# Patient Record
Sex: Female | Born: 1995 | Race: Black or African American | Hispanic: No | Marital: Single | Smoking: Never smoker
Health system: Southern US, Community
[De-identification: ages and names within clinical notes are randomized; demographics above are authoritative.]

## PROBLEM LIST (undated history)

## (undated) DIAGNOSIS — J45909 Unspecified asthma, uncomplicated: Secondary | ICD-10-CM

## (undated) DIAGNOSIS — Z8711 Personal history of peptic ulcer disease: Secondary | ICD-10-CM

## (undated) DIAGNOSIS — Z862 Personal history of diseases of the blood and blood-forming organs and certain disorders involving the immune mechanism: Secondary | ICD-10-CM

## (undated) DIAGNOSIS — Z8742 Personal history of other diseases of the female genital tract: Secondary | ICD-10-CM

## (undated) DIAGNOSIS — Z8619 Personal history of other infectious and parasitic diseases: Secondary | ICD-10-CM

## (undated) HISTORY — DX: Morbid (severe) obesity due to excess calories: E66.01

## (undated) HISTORY — DX: Personal history of diseases of the blood and blood-forming organs and certain disorders involving the immune mechanism: Z86.2

## (undated) HISTORY — DX: Personal history of other diseases of the female genital tract: Z87.42

## (undated) HISTORY — DX: Personal history of peptic ulcer disease: Z87.11

## (undated) HISTORY — PX: OTHER SURGICAL HISTORY: SHX169

## (undated) HISTORY — PX: APPENDECTOMY: SHX54

## (undated) HISTORY — DX: Personal history of other infectious and parasitic diseases: Z86.19

---
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
## 2018-03-18 ENCOUNTER — Emergency Department: Admission: EM | Admit: 2018-03-18 | Discharge: 2018-03-18 | Discharge status: Absent without leave | Payer: Self-pay

## 2018-03-28 ENCOUNTER — Other Ambulatory Visit: Payer: Self-pay

## 2018-03-28 ENCOUNTER — Encounter: Payer: Self-pay | Admitting: Emergency Medicine

## 2018-03-28 DIAGNOSIS — Z79899 Other long term (current) drug therapy: Secondary | ICD-10-CM | POA: Insufficient documentation

## 2018-03-28 DIAGNOSIS — R110 Nausea: Secondary | ICD-10-CM | POA: Insufficient documentation

## 2018-03-28 DIAGNOSIS — J45909 Unspecified asthma, uncomplicated: Secondary | ICD-10-CM | POA: Insufficient documentation

## 2018-03-28 DIAGNOSIS — E860 Dehydration: Principal | ICD-10-CM | POA: Insufficient documentation

## 2018-03-28 DIAGNOSIS — D72829 Elevated white blood cell count, unspecified: Secondary | ICD-10-CM | POA: Insufficient documentation

## 2018-03-28 DIAGNOSIS — R0981 Nasal congestion: Secondary | ICD-10-CM | POA: Insufficient documentation

## 2018-03-28 DIAGNOSIS — I959 Hypotension, unspecified: Secondary | ICD-10-CM | POA: Insufficient documentation

## 2018-03-28 DIAGNOSIS — Z634 Disappearance and death of family member: Secondary | ICD-10-CM | POA: Insufficient documentation

## 2018-03-28 DIAGNOSIS — R000 Tachycardia, unspecified: Secondary | ICD-10-CM | POA: Insufficient documentation

## 2018-03-28 DIAGNOSIS — Z888 Allergy status to other drugs, medicaments and biological substances status: Secondary | ICD-10-CM | POA: Insufficient documentation

## 2018-03-28 DIAGNOSIS — R509 Fever, unspecified: Secondary | ICD-10-CM | POA: Insufficient documentation

## 2018-03-28 DIAGNOSIS — J029 Acute pharyngitis, unspecified: Secondary | ICD-10-CM | POA: Insufficient documentation

## 2018-03-28 DIAGNOSIS — F4321 Adjustment disorder with depressed mood: Secondary | ICD-10-CM | POA: Insufficient documentation

## 2018-03-28 NOTE — ED Triage Notes (Signed)
Patient to ER from home via [facility] for c/o flu-like symptoms. Patient states she has had fever, chills, and body aches. Unknown temp at home.

## 2018-03-28 NOTE — ED Notes (Signed)
Pt declining flu swab at this time. Pt stating, "My nose hurts too bad. I don't want you sticking that down my nose." Pt informed that this was the only way to check for the flu and that it would take time to get the results back. This tech reminded pt that she was here for flu like symptoms...pt still declining flu swab.

## 2018-03-28 NOTE — ED Notes (Signed)
Patient refused flu swab

## 2018-03-29 ENCOUNTER — Observation Stay
Admission: EM | Admit: 2018-03-29 | Discharge: 2018-03-30 | Disposition: A | Discharge status: Home / Self Care | Payer: Self-pay | Attending: Family Medicine | Admitting: Family Medicine

## 2018-03-29 ENCOUNTER — Emergency Department: Payer: Self-pay

## 2018-03-29 DIAGNOSIS — F4321 Adjustment disorder with depressed mood: Secondary | ICD-10-CM

## 2018-03-29 DIAGNOSIS — R6889 Other general symptoms and signs: Secondary | ICD-10-CM | POA: Diagnosis present

## 2018-03-29 DIAGNOSIS — A419 Sepsis, unspecified organism: Secondary | ICD-10-CM

## 2018-03-29 HISTORY — DX: Unspecified asthma, uncomplicated: J45.909

## 2018-03-29 LAB — RESPIRATORY PANEL BY PCR

## 2018-03-29 LAB — URINALYSIS, COMPLETE (UACMP) WITH MICROSCOPIC
Bilirubin Urine: NEGATIVE
Glucose, UA: NEGATIVE mg/dL
HGB URINE DIPSTICK: NEGATIVE
Ketones, ur: 80 mg/dL — AB
Leukocytes, UA: NEGATIVE
NITRITE: NEGATIVE
Protein, ur: NEGATIVE mg/dL
Specific Gravity, Urine: 1.024 (ref 1.005–1.030)
pH: 5 (ref 5.0–8.0)

## 2018-03-29 LAB — CBC
HCT: 34.1 % — ABNORMAL LOW (ref 36.0–46.0)
HEMOGLOBIN: 10.7 g/dL — AB (ref 12.0–15.0)
MCH: 25.4 pg — ABNORMAL LOW (ref 26.0–34.0)
MCHC: 31.4 g/dL (ref 30.0–36.0)
MCV: 81 fL (ref 80.0–100.0)
Platelets: 203 10*3/uL (ref 150–400)
RBC: 4.21 MIL/uL (ref 3.87–5.11)
RDW: 14 % (ref 11.5–15.5)
WBC: 14.9 10*3/uL — ABNORMAL HIGH (ref 4.0–10.5)
nRBC: 0 % (ref 0.0–0.2)

## 2018-03-29 LAB — COMPREHENSIVE METABOLIC PANEL
ALK PHOS: 52 U/L (ref 38–126)
ALT: 11 U/L (ref 0–44)
AST: 12 U/L — ABNORMAL LOW (ref 15–41)
Albumin: 3.7 g/dL (ref 3.5–5.0)
Anion gap: 8 (ref 5–15)
BUN: 9 mg/dL (ref 6–20)
CO2: 23 mmol/L (ref 22–32)
CREATININE: 0.66 mg/dL (ref 0.44–1.00)
Calcium: 8.8 mg/dL — ABNORMAL LOW (ref 8.9–10.3)
Chloride: 105 mmol/L (ref 98–111)
GFR calc non Af Amer: >60 mL/min (ref >60)
Glucose, Bld: 86 mg/dL (ref 70–99)
Potassium: 3.3 mmol/L — ABNORMAL LOW (ref 3.5–5.1)
Sodium: 136 mmol/L (ref 135–145)
Total Bilirubin: 0.9 mg/dL (ref 0.3–1.2)
Total Protein: 7.5 g/dL (ref 6.5–8.1)

## 2018-03-29 LAB — MONONUCLEOSIS SCREEN: Mono Screen: NEGATIVE

## 2018-03-29 LAB — INFLUENZA PANEL BY PCR (TYPE A & B)
Influenza A By PCR: NEGATIVE
Influenza B By PCR: NEGATIVE

## 2018-03-29 LAB — POCT PREGNANCY, URINE: Preg Test, Ur: NEGATIVE

## 2018-03-29 LAB — LACTIC ACID, PLASMA: Lactic Acid, Venous: 0.8 mmol/L (ref 0.5–1.9)

## 2018-03-29 LAB — GLUCOSE, CAPILLARY: Glucose-Capillary: 84 mg/dL (ref 70–99)

## 2018-03-29 LAB — GROUP A STREP BY PCR: GROUP A STREP BY PCR: NOT DETECTED

## 2018-03-29 MED ORDER — SODIUM CHLORIDE 0.9 % IV BOLUS
1000.0000 mL | Freq: Once | INTRAVENOUS | Status: AC
  Administered 2018-03-29: 1000 mL via INTRAVENOUS

## 2018-03-29 MED ORDER — ACETAMINOPHEN 650 MG RE SUPP: 650.0000 mg | Freq: Four times a day (QID) | RECTAL | Status: DC | PRN

## 2018-03-29 MED ORDER — ALBUTEROL SULFATE (2.5 MG/3ML) 0.083% IN NEBU: 2.5000 mg | INHALATION_SOLUTION | RESPIRATORY_TRACT | Status: DC | PRN

## 2018-03-29 MED ORDER — SODIUM CHLORIDE 0.9% FLUSH
3.0000 mL | Freq: Two times a day (BID) | INTRAVENOUS | Status: DC
  Administered 2018-03-29 – 2018-03-30 (×2): 3 mL via INTRAVENOUS

## 2018-03-29 MED ORDER — ONDANSETRON HCL 4 MG/2ML IJ SOLN
4.0000 mg | Freq: Four times a day (QID) | INTRAMUSCULAR | Status: DC | PRN
  Administered 2018-03-29: 4 mg via INTRAVENOUS
  Filled 2018-03-29: qty 2

## 2018-03-29 MED ORDER — ACETAMINOPHEN 325 MG PO TABS
650.0000 mg | ORAL_TABLET | Freq: Four times a day (QID) | ORAL | Status: DC | PRN
  Administered 2018-03-29 – 2018-03-30 (×3): 650 mg via ORAL
  Filled 2018-03-29 (×3): qty 2

## 2018-03-29 MED ORDER — SODIUM CHLORIDE 0.9 % IV SOLN
INTRAVENOUS | Status: DC
  Administered 2018-03-29 – 2018-03-30 (×4): via INTRAVENOUS

## 2018-03-29 MED ORDER — SODIUM CHLORIDE 0.9 % IV SOLN
1.0000 g | Freq: Once | INTRAVENOUS | Status: AC
  Administered 2018-03-29: 1 g via INTRAVENOUS
  Filled 2018-03-29: qty 10

## 2018-03-29 MED ORDER — KETOROLAC TROMETHAMINE 30 MG/ML IJ SOLN
15.0000 mg | Freq: Once | INTRAMUSCULAR | Status: AC
  Administered 2018-03-29: 03:00:00 via INTRAVENOUS
  Filled 2018-03-29: qty 1

## 2018-03-29 MED ORDER — ONDANSETRON HCL 4 MG PO TABS
4.0000 mg | ORAL_TABLET | Freq: Four times a day (QID) | ORAL | Status: DC | PRN
  Administered 2018-03-30: 4 mg via ORAL
  Filled 2018-03-29: qty 1

## 2018-03-29 MED ORDER — ENOXAPARIN SODIUM 40 MG/0.4ML ~~LOC~~ SOLN
40.0000 mg | SUBCUTANEOUS | Status: DC
  Administered 2018-03-29: 40 mg via SUBCUTANEOUS
  Filled 2018-03-29: qty 0.4

## 2018-03-29 NOTE — Progress Notes (Signed)
CODE SEPSIS - PHARMACY COMMUNICATION  **Broad Spectrum Antibiotics should be administered within 1 hour of Sepsis diagnosis**  Time Code Sepsis Called/Page Received: 0205 0601  Antibiotics Ordered: 0205 0422  Time of 1st antibiotic administration: 0205 0519  Additional action taken by pharmacy:   If necessary, Name of Provider/Nurse Contacted:     Erich Montane ,PharmD Clinical Pharmacist  03/29/2018  6:10 AM

## 2018-03-29 NOTE — ED Notes (Signed)
Salary MD notified of pt's low BP reading. No new orders.

## 2018-03-29 NOTE — ED Notes (Signed)
Salary MD paged per family request.

## 2018-03-29 NOTE — ED Notes (Signed)
Pt transported to room 224 

## 2018-03-29 NOTE — ED Notes (Addendum)
Pt given snack and drink okay per EDP Williams. Pt given call bell.

## 2018-03-29 NOTE — ED Notes (Signed)
Low BP. Cuff not loose. When asked pt states lightheaded. BP taken again 99/69. Pt still lightheaded. States HA.

## 2018-03-29 NOTE — ED Notes (Signed)
Pt up to bedside toilet to urinate.  

## 2018-03-29 NOTE — ED Notes (Signed)
Salary MD at bedside to speak with pt and family.

## 2018-03-29 NOTE — Progress Notes (Signed)
1.  Flulike symptoms with dehydration: Patient started on IV hydration.  Check respiratory panel.  Symptomatic treatment.  Supportive care.  Patient has received IV Rocephin in the emergency room.  Will hold off on antibiotic treatment.  2.  Leukocytosis: Likely reactive.  Monitor off of antibiotic.  Monitor for signs of infection closely.  3.  History of asthma: Stable  DVT prophylaxis: Lovenox subcutaneous.  Case discussed with the patient and her numerous family members with all questions answered, agree with above plan of action.

## 2018-03-29 NOTE — ED Notes (Signed)
Pt given food tray.

## 2018-03-29 NOTE — ED Notes (Signed)
Will call floor again to attempt report.

## 2018-03-29 NOTE — H&P (Signed)
[facility] [place] [facility]   PATIENT NAME: Catherine Hanson    MR#:  [id number]  DATE OF BIRTH:  08/26/1995  DATE OF ADMISSION:  03/29/2018  PRIMARY CARE PHYSICIAN: Patient, No Pcp Per   REQUESTING/REFERRING PHYSICIAN: Dr. Manson Passey  CHIEF COMPLAINT:   Chief Complaint  Patient presents with  . Fever    HISTORY OF PRESENT ILLNESS:  Catherine Hanson  is a 23 y.o. female with a known history listed below presented to emergency room for evaluation of flulike symptoms.  Patient has subjective fever, chills, body ache, joint aches, nasal and sinus congestion, sore throat for last few days.  Patient also has nausea without vomiting.  Patient is not eating and drinking well.  Patient denies chest pain or shortness of breath.  Patient denies wheezing.  No abdominal pain or diarrhea.  Patient has sick contacts at home.  Patient's cousin was recently diagnosed with flu and ended up on life support and passing away.  In emergency room patient is hypotensive and tachycardic.  Patient appears dehydrated.  Patient received IV hydration.  Flu test is negative in emergency room.  Chest x-ray without any acute process.  Hospitalist team requested for observation.  PAST MEDICAL HISTORY:   Past Medical History:  Diagnosis Date  . Asthma     PAST SURGICAL HISTORY:   Past Surgical History:  Procedure Laterality Date  . APPENDECTOMY      SOCIAL HISTORY:   Social History   Tobacco Use  . Smoking status: Never Smoker  . Smokeless tobacco: Never Used  Substance Use Topics  . Alcohol use: Never    Frequency: Never    FAMILY HISTORY:  No family history on file.  DRUG ALLERGIES:   Allergies  Allergen Reactions  . Tamiflu [Oseltamivir Phosphate] Hives    REVIEW OF SYSTEMS:   ROS -12 point review of system reviewed.  Positive as per HPI otherwise negative.  MEDICATIONS AT HOME:   Prior to Admission medications   Medication Sig Start Date End Date Taking?  Authorizing Provider  albuterol (PROVENTIL HFA;VENTOLIN HFA) 108 (90 Base) MCG/ACT inhaler Inhale 2 puffs into the lungs every 4 (four) hours as needed. 11/08/12  Yes [provider]      VITAL SIGNS:  Blood pressure 100/60, pulse 92, temperature 98.8 F (37.1 C), temperature source Oral, resp. rate 16, height 5\' 5"  (1.651 m), weight 99.8 kg, last menstrual period 03/07/2018, SpO2 99 %.  PHYSICAL EXAMINATION:  Physical Exam  GENERAL:  23 y.o.-year-old patient lying in the bed with no acute distress.  EYES: Pupils equal, round, reactive to light and accommodation. No scleral icterus. Extraocular muscles intact.  HEENT: Head atraumatic, normocephalic.   NECK:  Supple, no jugular venous distention. No thyroid enlargement, no tenderness.  LUNGS: Normal breath sounds bilaterally, no wheezing, rales,rhonchi or crepitation. No use of accessory muscles of respiration.  CARDIOVASCULAR: S1, S2 normal. No murmurs, rubs, or gallops.  ABDOMEN: Soft, nontender, nondistended. Bowel sounds present. No organomegaly or mass.  EXTREMITIES: No pedal edema, cyanosis, or clubbing.  NEUROLOGIC: Cranial nerves II through XII are intact. Muscle strength 5/5 in all extremities. Sensation intact. Gait not checked.  PSYCHIATRIC: The patient is alert and oriented x 3.  SKIN: No obvious rash, lesion, or ulcer.   LABORATORY PANEL:   CBC Recent Labs  Lab 03/29/18 0230  WBC 14.9*  HGB 10.7*  HCT 34.1*  PLT 203   ------------------------------------------------------------------------------------------------------------------  Chemistries  Recent Labs  Lab 03/29/18 0230  NA 136  K 3.3*  CL 105  CO2 23  GLUCOSE 86  BUN 9  CREATININE 0.66  CALCIUM 8.8*  AST 12*  ALT 11  ALKPHOS 52  BILITOT 0.9   ------------------------------------------------------------------------------------------------------------------  Cardiac Enzymes No results for input(s): TROPONINI in the last 168  hours. ------------------------------------------------------------------------------------------------------------------  RADIOLOGY:  Dg Chest 2 View  Result Date: 03/29/2018 CLINICAL DATA:  Flu-like symptoms.  Fever, chills, body aches. EXAM: CHEST - 2 VIEW COMPARISON:  None. FINDINGS: The heart size and mediastinal contours are within normal limits. Both lungs are clear. The visualized skeletal structures are unremarkable. IMPRESSION: No active cardiopulmonary disease. Electronically Signed   By: Burman Nieves M.D.   On: 03/29/2018 03:47      IMPRESSION AND PLAN:   1.  Flulike symptoms with dehydration: Patient started on IV hydration.  Check respiratory panel.  Symptomatic treatment.  Supportive care.  Patient has received IV Rocephin in the emergency room.  Will hold off on antibiotic treatment.  2.  Leukocytosis: Likely reactive.  Monitor off of antibiotic.  Monitor for signs of infection closely.  3.  History of asthma: Stable  DVT prophylaxis: Lovenox subcutaneous.  Further treatment based on clinical course.  All the records are reviewed and case discussed with ED provider. Management plans discussed with the patient, family and they are in agreement.  CODE STATUS: Full  TOTAL TIME TAKING CARE OF THIS PATIENT: 30 minutes.    Montez Morita M.D on 03/29/2018 at 5:51 AM  Between 7am to 6pm - Pager - [phone]  After 6pm go to [email or web address] - Social research officer, government  [facility] [place] [facility]  Office  [phone]  CC: Primary care physician; Patient, No Pcp Per

## 2018-03-29 NOTE — ED Provider Notes (Addendum)
[facility] Emergency Department Provider Note _____   First MD Initiated Contact with Patient 03/29/18 [phone]     (approximate)  I have reviewed the triage vital signs and the nursing notes.   HISTORY  Chief Complaint Fever    HPI Catherine Hanson is a 23 y.o. female resents to the emergency department with 2-day history of generalized body aches fever chills sore throat.  Patient denies any vomiting or diarrhea.  Patient denies any urinary symptoms.  Patient denies any abdominal pain.  Of note patient states that her cousin which she spent a lot of time with was recently diagnosed with influenza and unfortunately subsequently died.  On arrival patient's temperature 99.7 orally tachypneic with a respiratory rate of 24 hypotensive with a blood pressure of 159.   Past Medical History:  Diagnosis Date  . Asthma     Patient Active Problem List   Diagnosis Date Noted  . Flu-like symptoms 03/29/2018    Past Surgical History:  Procedure Laterality Date  . APPENDECTOMY      Prior to Admission medications   Medication Sig Start Date End Date Taking? Authorizing Provider  albuterol (PROVENTIL HFA;VENTOLIN HFA) 108 (90 Base) MCG/ACT inhaler Inhale 2 puffs into the lungs every 4 (four) hours as needed. 11/08/12  Yes [provider]    Allergies Tamiflu [oseltamivir phosphate]  No family history on file.  Social History Social History   Tobacco Use  . Smoking status: Never Smoker  . Smokeless tobacco: Never Used  Substance Use Topics  . Alcohol use: Never    Frequency: Never  . Drug use: Not on file    Review of Systems Constitutional: Positive for fever/chills Eyes: No visual changes. ENT: Positive for sore throat. Cardiovascular: Denies chest pain. Respiratory: Denies shortness of breath. Gastrointestinal: No abdominal pain.  No nausea, no vomiting.  No diarrhea.  No constipation. Genitourinary: Negative for dysuria. Musculoskeletal:  Stated for generalized muscle aches Integumentary: Negative for rash. Neurological: Negative for headaches, focal weakness or numbness.   ____________________________________________   PHYSICAL EXAM:  VITAL SIGNS: ED Triage Vitals  Enc Vitals Group     BP 03/28/18 2159 (!) 100/59     Pulse Rate 03/28/18 2159 100     Resp 03/28/18 2159 (!) 24     Temp 03/28/18 2159 99.7 F (37.6 C)     Temp Source 03/28/18 2159 Oral     SpO2 03/28/18 2159 98 %     Weight 03/28/18 2159 99.8 kg (220 lb)     Height 03/28/18 2159 1.651 m (5\' 5" )     Head Circumference --      Peak Flow --      Pain Score 03/28/18 2205 8     Pain Loc --      Pain Edu? --      Excl. in GC? --     Constitutional: Alert and oriented.  Ill-appearing  eyes: Conjunctivae are normal.  Mouth/Throat: Mucous membranes are moist. Oropharynx non-erythematous. Neck: No stridor.  No meningeal signs.   Cardiovascular: Normal rate, regular rhythm. Good peripheral circulation. Grossly normal heart sounds. Respiratory: Normal respiratory effort.  No retractions. Lungs CTAB. Gastrointestinal: Soft and nontender. No distention.  Musculoskeletal: No lower extremity tenderness nor edema. No gross deformities of extremities. Neurologic:  Normal speech and language. No gross focal neurologic deficits are appreciated.  Skin:  Skin is warm, dry and intact. No rash noted.   ____________________________________________   LABS (all labs ordered are listed, but  only abnormal results are displayed)  Labs Reviewed  URINALYSIS, COMPLETE (UACMP) WITH MICROSCOPIC - Abnormal; Notable for the following components:      Result Value   Color, Urine YELLOW (*)    APPearance CLOUDY (*)    Ketones, ur 80 (*)    Bacteria, UA RARE (*)    All other components within normal limits  CBC - Abnormal; Notable for the following components:   WBC 14.9 (*)    Hemoglobin 10.7 (*)    HCT 34.1 (*)    MCH 25.4 (*)    All other components within normal  limits  COMPREHENSIVE METABOLIC PANEL - Abnormal; Notable for the following components:   Potassium 3.3 (*)    Calcium 8.8 (*)    AST 12 (*)    All other components within normal limits  GROUP A STREP BY PCR  CULTURE, BLOOD (ROUTINE X 2)  CULTURE, BLOOD (ROUTINE X 2)  RESPIRATORY PANEL BY PCR  INFLUENZA PANEL BY PCR (TYPE A & B)  LACTIC ACID, PLASMA  MONONUCLEOSIS SCREEN  HIV ANTIBODY (ROUTINE TESTING W REFLEX)  CBC  CREATININE, SERUM  POCT PREGNANCY, URINE   ____________________________  RADIOLOGY I, [place] N , personally viewed and evaluated these images (plain radiographs) as part of my medical decision making, as well as reviewing the written report by the radiologist.  ED MD interpretation: No active cardiopulmonary disease on chest x-ray per radiologist  Official radiology report(s): Dg Chest 2 View  Result Date: 03/29/2018 CLINICAL DATA:  Flu-like symptoms.  Fever, chills, body aches. EXAM: CHEST - 2 VIEW COMPARISON:  None. FINDINGS: The heart size and mediastinal contours are within normal limits. Both lungs are clear. The visualized skeletal structures are unremarkable. IMPRESSION: No active cardiopulmonary disease. Electronically Signed   By: Burman Nieves M.D.   On: 03/29/2018 03:47      .Critical Care Performed by: Darci Current, MD Authorized by: Darci Current, MD   Critical care provider statement:    Critical care time (minutes):  30   Critical care time was exclusive of:  Separately billable procedures and treating other patients   Critical care was necessary to treat or prevent imminent or life-threatening deterioration of the following conditions:  Sepsis   Critical care was time spent personally by me on the following activities:  Development of treatment plan with patient or surrogate, discussions with consultants, evaluation of patient's response to treatment, examination of patient, obtaining history from patient or surrogate, ordering  and performing treatments and interventions, ordering and review of laboratory studies, ordering and review of radiographic studies, pulse oximetry, re-evaluation of patient's condition and review of old charts     ____________________________________________   INITIAL IMPRESSION / ASSESSMENT AND PLAN / ED COURSE  As part of my medical decision making, I reviewed the following data within the electronic MEDICAL RECORD NUMBER   23 year old female presenting with above-stated history and physical exam differential diagnosis includes influenza pneumonia urinary tract infection patient meets criteria for sepsis and as such sepsis protocol initiated.  Patient received 2 L IV normal saline and remained hypotensive and a such additional liter of fluid given.  Laboratory data notable thus far for white blood cell count of 14.9 and urinalysis revealing ketones of 80.  Concern for the possibility of sepsis and as such patient discussed with Dr. Allena Katz hospitalist for hospital admission for further evaluation and management.    ____________________________________________  FINAL CLINICAL IMPRESSION(S) / ED DIAGNOSES  Sepsis Flulike illness  MEDICATIONS GIVEN  DURING THIS VISIT:  Medications  0.9 %  sodium chloride infusion (has no administration in time range)  acetaminophen (TYLENOL) tablet 650 mg (has no administration in time range)    Or  acetaminophen (TYLENOL) suppository 650 mg (has no administration in time range)  ondansetron (ZOFRAN) tablet 4 mg (has no administration in time range)    Or  ondansetron (ZOFRAN) injection 4 mg (has no administration in time range)  enoxaparin (LOVENOX) injection 40 mg (has no administration in time range)  sodium chloride flush (NS) 0.9 % injection 3 mL (has no administration in time range)  albuterol (PROVENTIL) (2.5 MG/3ML) 0.083% nebulizer solution 2.5 mg (has no administration in time range)  sodium chloride 0.9 % bolus 1,000 mL (0 mLs Intravenous  Stopped 03/29/18 0445)  sodium chloride 0.9 % bolus 1,000 mL (0 mLs Intravenous Stopped 03/29/18 0309)  ketorolac (TORADOL) 30 MG/ML injection 15 mg ( Intravenous Given 03/29/18 0309)  cefTRIAXone (ROCEPHIN) 1 g in sodium chloride 0.9 % 100 mL IVPB (1 g Intravenous New Bag/Given 03/29/18 0519)  sodium chloride 0.9 % bolus 1,000 mL (1,000 mLs Intravenous New Bag/Given 03/29/18 0518)     ED Discharge Orders    None       Note:  This document was prepared using Dragon voice recognition software and may include unintentional dictation errors.   Darci CurrentBrown, Edith [place] N, MD 03/29/18 [id number]    Darci CurrentBrown, [place] N, MD 04/20/18 [id number]

## 2018-03-30 DIAGNOSIS — F4321 Adjustment disorder with depressed mood: Secondary | ICD-10-CM

## 2018-03-30 DIAGNOSIS — F1410 Cocaine abuse, uncomplicated: Secondary | ICD-10-CM

## 2018-03-30 DIAGNOSIS — F411 Generalized anxiety disorder: Secondary | ICD-10-CM

## 2018-03-30 DIAGNOSIS — Z8659 Personal history of other mental and behavioral disorders: Secondary | ICD-10-CM

## 2018-03-30 DIAGNOSIS — G4721 Circadian rhythm sleep disorder, delayed sleep phase type: Secondary | ICD-10-CM

## 2018-03-30 LAB — HIV ANTIBODY (ROUTINE TESTING W REFLEX): HIV Screen 4th Generation wRfx: NONREACTIVE

## 2018-03-30 LAB — CBC
HCT: 31.9 % — ABNORMAL LOW (ref 36.0–46.0)
Hemoglobin: 9.8 g/dL — ABNORMAL LOW (ref 12.0–15.0)
MCH: 25.5 pg — ABNORMAL LOW (ref 26.0–34.0)
MCHC: 30.7 g/dL (ref 30.0–36.0)
MCV: 82.9 fL (ref 80.0–100.0)
Platelets: 186 10*3/uL (ref 150–400)
RBC: 3.85 MIL/uL — ABNORMAL LOW (ref 3.87–5.11)
RDW: 14.2 % (ref 11.5–15.5)
WBC: 8.8 10*3/uL (ref 4.0–10.5)
nRBC: 0 % (ref 0.0–0.2)

## 2018-03-30 LAB — COMPREHENSIVE METABOLIC PANEL
ALT: 7 U/L (ref 0–44)
AST: 12 U/L — AB (ref 15–41)
Albumin: 3 g/dL — ABNORMAL LOW (ref 3.5–5.0)
Alkaline Phosphatase: 42 U/L (ref 38–126)
Anion gap: 4 — ABNORMAL LOW (ref 5–15)
BUN: 8 mg/dL (ref 6–20)
CO2: 24 mmol/L (ref 22–32)
Calcium: 8.1 mg/dL — ABNORMAL LOW (ref 8.9–10.3)
Chloride: 111 mmol/L (ref 98–111)
Creatinine, Ser: 0.64 mg/dL (ref 0.44–1.00)
GFR calc Af Amer: >60 mL/min (ref >60)
GFR calc non Af Amer: >60 mL/min (ref >60)
Glucose, Bld: 85 mg/dL (ref 70–99)
Potassium: 3.6 mmol/L (ref 3.5–5.1)
Sodium: 139 mmol/L (ref 135–145)
Total Bilirubin: 0.4 mg/dL (ref 0.3–1.2)
Total Protein: 6.3 g/dL — ABNORMAL LOW (ref 6.5–8.1)

## 2018-03-30 LAB — MAGNESIUM: Magnesium: 1.9 mg/dL (ref 1.7–2.4)

## 2018-03-30 LAB — GLUCOSE, CAPILLARY: GLUCOSE-CAPILLARY: 75 mg/dL (ref 70–99)

## 2018-03-30 NOTE — Discharge Summary (Signed)
[facility] - [place] at [facility]   PATIENT NAME: Catherine Hanson    MR#:  [id number]  DATE OF BIRTH:  December 14, 1995  DATE OF ADMISSION:  03/29/2018 ADMITTING PHYSICIAN: Montez Morita, MD  DATE OF DISCHARGE: No discharge date for patient encounter.  PRIMARY CARE PHYSICIAN: Patient, No Pcp Per    ADMISSION DIAGNOSIS:  Sepsis, due to unspecified organism, unspecified whether acute organ dysfunction present (HCC) [A41.9]  DISCHARGE DIAGNOSIS:  Active Problems:   Flu-like symptoms   SECONDARY DIAGNOSIS:   Past Medical History:  Diagnosis Date  . Asthma     HOSPITAL COURSE:  *Acute bereavement Recently lost close family member, complains of depression without suicidal ideation Psychiatry did see patient while in house-no medication recommended, did not meet inpatient psychiatric admission criteria, will be discharged home in the care of family with educational material, will need follow-up with primary care provider in 3 to 5 days for reevaluation  *Acute Flulike symptoms with dehydration Primary issue as above-I believe that this is primarily a red herring Treated with supportive care, IV fluids for rehydration  *Acute leukocytosis Likely reactive Antibiotics were avoided  *Chronic asthma without exacerbation  Stable  Breathing treatments PRN provided   DISCHARGE CONDITIONS:   stable  CONSULTS OBTAINED:  Treatment Team:  Mariel Craft, MD  DRUG ALLERGIES:   Allergies  Allergen Reactions  . Tamiflu [Oseltamivir Phosphate] Hives    DISCHARGE MEDICATIONS:   Allergies as of 03/30/2018      Reactions   Tamiflu [oseltamivir Phosphate] Hives      Medication List    TAKE these medications   albuterol 108 (90 Base) MCG/ACT inhaler Commonly known as:  PROVENTIL HFA;VENTOLIN HFA Inhale 2 puffs into the lungs every 4 (four) hours as needed.        DISCHARGE INSTRUCTIONS:   If you experience worsening of your admission symptoms,  develop shortness of breath, life threatening emergency, suicidal or homicidal thoughts you must seek medical attention immediately by calling 911 or calling your MD immediately  if symptoms less severe.  You Must read complete instructions/literature along with all the possible adverse reactions/side effects for all the Medicines you take and that have been prescribed to you. Take any new Medicines after you have completely understood and accept all the possible adverse reactions/side effects.   Please note  You were cared for by a hospitalist during your hospital stay. If you have any questions about your discharge medications or the care you received while you were in the hospital after you are discharged, you can call the unit and asked to speak with the hospitalist on call if the hospitalist that took care of you is not available. Once you are discharged, your primary care physician will handle any further medical issues. Please note that NO REFILLS for any discharge medications will be authorized once you are discharged, as it is imperative that you return to your primary care physician (or establish a relationship with a primary care physician if you do not have one) for your aftercare needs so that they can reassess your need for medications and monitor your lab values.    Today   CHIEF COMPLAINT:   Chief Complaint  Patient presents with  . Fever    HISTORY OF PRESENT ILLNESS:   23 y.o. female with a known history listed below presented to emergency room for evaluation of flulike symptoms.  Patient has subjective fever, chills, body ache, joint aches, nasal and sinus congestion, sore throat  for last few days.  Patient also has nausea without vomiting.  Patient is not eating and drinking well.  Patient denies chest pain or shortness of breath.  Patient denies wheezing.  No abdominal pain or diarrhea.  Patient has sick contacts at home.  Patient's cousin was recently diagnosed with flu and  ended up on life support and passing away.  In emergency room patient is hypotensive and tachycardic.  Patient appears dehydrated.  Patient received IV hydration.  Flu test is negative in emergency room.  Chest x-ray without any acute process.  Hospitalist team requested for observation.  VITAL SIGNS:  Blood pressure 103/65, pulse 62, temperature 98.4 F (36.9 C), temperature source Oral, resp. rate 18, height 5\' 5"  (1.651 m), weight 103.5 kg, last menstrual period 03/07/2018, SpO2 99 %.  I/O:    Intake/Output Summary (Last 24 hours) at 03/30/2018 1141 Last data filed at 03/30/2018 0900 Gross per 24 hour  Intake 3352.11 ml  Output 400 ml  Net 2952.11 ml    PHYSICAL EXAMINATION:  GENERAL:  23 y.o.-year-old patient lying in the bed with no acute distress.  EYES: Pupils equal, round, reactive to light and accommodation. No scleral icterus. Extraocular muscles intact.  HEENT: Head atraumatic, normocephalic. Oropharynx and nasopharynx clear.  NECK:  Supple, no jugular venous distention. No thyroid enlargement, no tenderness.  LUNGS: Normal breath sounds bilaterally, no wheezing, rales,rhonchi or crepitation. No use of accessory muscles of respiration.  CARDIOVASCULAR: S1, S2 normal. No murmurs, rubs, or gallops.  ABDOMEN: Soft, non-tender, non-distended. Bowel sounds present. No organomegaly or mass.  EXTREMITIES: No pedal edema, cyanosis, or clubbing.  NEUROLOGIC: Cranial nerves II through XII are intact. Muscle strength 5/5 in all extremities. Sensation intact. Gait not checked.  PSYCHIATRIC: The patient is alert and oriented x 3.  SKIN: No obvious rash, lesion, or ulcer.   DATA REVIEW:   CBC Recent Labs  Lab 03/30/18 0431  WBC 8.8  HGB 9.8*  HCT 31.9*  PLT 186    Chemistries  Recent Labs  Lab 03/30/18 0431  NA 139  K 3.6  CL 111  CO2 24  GLUCOSE 85  BUN 8  CREATININE 0.64  CALCIUM 8.1*  MG 1.9  AST 12*  ALT 7  ALKPHOS 42  BILITOT 0.4    Cardiac Enzymes No  results for input(s): TROPONINI in the last 168 hours.  Microbiology Results  Results for orders placed or performed during the hospital encounter of 03/29/18  Respiratory Panel by PCR     Status: None   Collection Time: 03/28/18 11:24 PM  Result Value Ref Range Status   Adenovirus NOT DETECTED NOT DETECTED Final   Coronavirus 229E NOT DETECTED NOT DETECTED Final    Comment: (NOTE) The Coronavirus on the Respiratory Panel, DOES NOT test for the novel  Coronavirus (2019 nCoV)    Coronavirus HKU1 NOT DETECTED NOT DETECTED Final   Coronavirus NL63 NOT DETECTED NOT DETECTED Final   Coronavirus OC43 NOT DETECTED NOT DETECTED Final   Metapneumovirus NOT DETECTED NOT DETECTED Final   Rhinovirus / Enterovirus NOT DETECTED NOT DETECTED Final   Influenza A NOT DETECTED NOT DETECTED Final   Influenza B NOT DETECTED NOT DETECTED Final   Parainfluenza Virus 1 NOT DETECTED NOT DETECTED Final   Parainfluenza Virus 2 NOT DETECTED NOT DETECTED Final   Parainfluenza Virus 3 NOT DETECTED NOT DETECTED Final   Parainfluenza Virus 4 NOT DETECTED NOT DETECTED Final   Respiratory Syncytial Virus NOT DETECTED NOT DETECTED Final  Bordetella pertussis NOT DETECTED NOT DETECTED Final   Chlamydophila pneumoniae NOT DETECTED NOT DETECTED Final   Mycoplasma pneumoniae NOT DETECTED NOT DETECTED Final    Comment: Performed at [facility], [address] [place], [place] [address]  Group A Strep by PCR [facility] Only)     Status: None   Collection Time: 03/29/18  2:30 AM  Result Value Ref Range Status   Group A Strep by PCR NOT DETECTED NOT DETECTED Final    Comment: Performed at [facility], [address] [place], [place] [address]  Blood culture (routine x 2)     Status: None (Preliminary result)   Collection Time: 03/29/18  4:41 AM  Result Value Ref Range Status   Specimen Description BLOOD LEFT [facility]  Final   Special Requests   Final    BOTTLES DRAWN AEROBIC AND ANAEROBIC Blood Culture  adequate volume   Culture   Final    NO GROWTH 1 DAY Performed at [facility], [address] [place], [place] [address]    Report Status PENDING  Incomplete  Blood culture (routine x 2)     Status: None (Preliminary result)   Collection Time: 03/29/18  4:41 AM  Result Value Ref Range Status   Specimen Description BLOOD RIGHT [facility]  Final   Special Requests   Final    BOTTLES DRAWN AEROBIC AND ANAEROBIC Blood Culture adequate volume   Culture   Final    NO GROWTH 1 DAY Performed at [facility], [address] [place], [place] [address]    Report Status PENDING  Incomplete    RADIOLOGY:  Dg Chest 2 View  Result Date: 03/29/2018 CLINICAL DATA:  Flu-like symptoms.  Fever, chills, body aches. EXAM: CHEST - 2 VIEW COMPARISON:  None. FINDINGS: The heart size and mediastinal contours are within normal limits. Both lungs are clear. The visualized skeletal structures are unremarkable. IMPRESSION: No active cardiopulmonary disease. Electronically Signed   By: Burman NievesWilliam  Stevens M.D.   On: 03/29/2018 03:47    EKG:   Orders placed or performed during the hospital encounter of 03/29/18  . ED EKG 12-Lead  . ED EKG 12-Lead  . EKG 12-Lead  . EKG 12-Lead      Management plans discussed with the patient, family and they are in agreement.  CODE STATUS:     Code Status Orders  (From admission, onward)         Start     Ordered   03/29/18 0541  Full code  Continuous     03/29/18 0540        Code Status History    This patient has a current code status but no historical code status.      TOTAL TIME TAKING CARE OF THIS PATIENT: 40 minutes.    Evelena AsaMontell D  M.D on 03/30/2018 at 11:41 AM  Between 7am to 6pm - Pager - [phone]  After 6pm go to [email or web address] - password [email or web address]  [facility]  Office  [phone]  CC: Primary care physician; Patient, No Pcp Per   Note: This dictation was prepared with Dragon dictation along with  smaller phrase technology. Any transcriptional errors that result from this process are unintentional.
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
## 2018-03-30 NOTE — Care Management (Signed)
Patient discharge home today.  Her mom will be staying with her at discharge.  No new medication at discharge.  Provided patient with application to [facility] .  Patient was also provided pamphlet for [facility] care services.

## 2018-04-03 LAB — CULTURE, BLOOD (ROUTINE X 2)
Culture: NO GROWTH
Culture: NO GROWTH
Special Requests: ADEQUATE
Special Requests: ADEQUATE

## 2018-05-01 NOTE — ED Provider Notes (Signed)
ED ECG REPORT I, Florissant N BROWN, the attending physician, personally viewed and interpreted this ECG.   Date: 03/29/2018  EKG Time: March 29, 2018  Rate: 77  Rhythm: Normal sinus rhythm  Axis: Normal  Intervals: Normal  ST&T Change: None    Darci Current, MD 05/01/18 2240

## 2019-03-29 ENCOUNTER — Encounter: Payer: Self-pay | Admitting: Emergency Medicine

## 2019-03-29 ENCOUNTER — Other Ambulatory Visit: Payer: Self-pay

## 2019-03-29 ENCOUNTER — Ambulatory Visit
Admission: EM | Admit: 2019-03-29 | Discharge: 2019-03-29 | Disposition: A | Discharge status: Home / Self Care | Payer: Medicaid Other | Attending: Family Medicine | Admitting: Family Medicine

## 2019-03-29 DIAGNOSIS — R1013 Epigastric pain: Secondary | ICD-10-CM | POA: Insufficient documentation

## 2019-03-29 DIAGNOSIS — Z3201 Encounter for pregnancy test, result positive: Secondary | ICD-10-CM | POA: Diagnosis present

## 2019-03-29 LAB — URINALYSIS, COMPLETE (UACMP) WITH MICROSCOPIC
Bilirubin Urine: NEGATIVE
Glucose, UA: NEGATIVE mg/dL
Hgb urine dipstick: NEGATIVE
Ketones, ur: 40 mg/dL — AB
Nitrite: NEGATIVE
Protein, ur: NEGATIVE mg/dL
Specific Gravity, Urine: 1.025 (ref 1.005–1.030)
pH: 6 (ref 5.0–8.0)

## 2019-03-29 LAB — PREGNANCY, URINE: Preg Test, Ur: POSITIVE — AB

## 2019-03-29 MED ORDER — PRENATAL VITAMINS 28-0.8 MG PO TABS: 1.0000 | ORAL_TABLET | Freq: Every day | ORAL | 3 refills | Status: DC

## 2019-03-29 NOTE — ED Provider Notes (Signed)
[facility]    CSN: [id number] Arrival date & time: 03/29/19  1343      History   Chief Complaint Chief Complaint  Patient presents with  . Abdominal Pain    HPI   24 year old female presents with abdominal pain.  1 week history of upper abdominal pain.  Has a history of GERD as well as gastric ulcer.  Reports associated nausea.  Denies vomiting.  Denies diarrhea.  She does report that she does not have frequent bowel movements.  She may go once a week.  She states that she is late for her menstrual cycle.  She is having unprotected intercourse and is concerned that she may be pregnant.  Patient reports that she eats late at night and this may be exacerbating her abdominal pain.  No relieving factors.  No fever.  No appetite change.  No other associated symptoms.  No other complaints.  Past Medical History:  Diagnosis Date  . Asthma   Hx of GERD Hx of gastric ulcer  Past Surgical History:  Procedure Laterality Date  . APPENDECTOMY      OB History   No obstetric history on file.      Home Medications    Prior to Admission medications   Medication Sig Start Date End Date Taking? Authorizing Provider  Prenatal Vit-Fe Fumarate-FA (PRENATAL VITAMINS) 28-0.8 MG TABS Take 1 tablet by mouth daily. 03/29/19   Tommie Sams, DO  albuterol (PROVENTIL HFA;VENTOLIN HFA) 108 (90 Base) MCG/ACT inhaler Inhale 2 puffs into the lungs every 4 (four) hours as needed. 11/08/12 03/29/19  [provider]   Social History Social History   Tobacco Use  . Smoking status: Never Smoker  . Smokeless tobacco: Never Used  Substance Use Topics  . Alcohol use: Never  . Drug use: Never     Allergies   Tamiflu [oseltamivir phosphate]   Review of Systems Review of Systems  Gastrointestinal: Positive for abdominal pain and nausea.  Genitourinary: Positive for menstrual problem.   Physical Exam Triage Vital Signs ED Triage Vitals  Enc Vitals Group     BP 03/29/19 1406  127/79     Pulse Rate 03/29/19 1406 85     Resp 03/29/19 1406 18     Temp 03/29/19 1406 98.5 F (36.9 C)     Temp Source 03/29/19 1406 Oral     SpO2 03/29/19 1406 100 %     Weight 03/29/19 1405 246 lb (111.6 kg)     Height 03/29/19 1405 5\' 5"  (1.651 m)     Head Circumference --      Peak Flow --      Pain Score 03/29/19 1404 6     Pain Loc --      Pain Edu? --      Excl. in GC? --    Updated Vital Signs BP 127/79 (BP Location: Right Arm)   Pulse 85   Temp 98.5 F (36.9 C) (Oral)   Resp 18   Ht 5\' 5"  (1.651 m)   Wt 111.6 kg   LMP 02/28/2019   SpO2 100%   BMI 40.94 kg/m   Visual Acuity Right Eye Distance:   Left Eye Distance:   Bilateral Distance:    Right Eye Near:   Left Eye Near:    Bilateral Near:     Physical Exam Constitutional:      General: She is not in acute distress.    Appearance: Normal appearance. She is obese. She is not  ill-appearing.  HENT:     Head: Normocephalic and atraumatic.  Eyes:     General:        Right eye: No discharge.        Left eye: No discharge.     Conjunctiva/sclera: Conjunctivae normal.  Cardiovascular:     Rate and Rhythm: Normal rate and regular rhythm.     Heart sounds: No murmur.  Pulmonary:     Effort: Pulmonary effort is normal.     Breath sounds: Normal breath sounds. No wheezing, rhonchi or rales.  Abdominal:     General: There is no distension.     Palpations: Abdomen is soft.     Comments: Epigastric tenderness to palpation.  Neurological:     Mental Status: She is alert.  Psychiatric:        Mood and Affect: Mood normal.        Behavior: Behavior normal.    UC Treatments / Results  Labs (all labs ordered are listed, but only abnormal results are displayed) Labs Reviewed  URINALYSIS, COMPLETE (UACMP) WITH MICROSCOPIC - Abnormal; Notable for the following components:      Result Value   APPearance HAZY (*)    Ketones, ur 40 (*)    Leukocytes,Ua TRACE (*)    Bacteria, UA RARE (*)    All other  components within normal limits  PREGNANCY, URINE - Abnormal; Notable for the following components:   Preg Test, Ur POSITIVE (*)    All other components within normal limits    EKG   Radiology No results found.  Procedures Procedures (including critical care time)  Medications Ordered in UC Medications - No data to display  Initial Impression / Assessment and Plan / UC Course  I have reviewed the triage vital signs and the nursing notes.  Pertinent labs & imaging results that were available during my care of the patient were reviewed by me and considered in my medical decision making (see chart for details).    24 year old female presents with epigastric pain.  She also is late for her menstrual cycle.  Pregnancy test positive.  Epigastric tenderness on exam.  Advised Tums and dietary changes.  Prenatal vitamin as prescribed.  Advised to see a local OB/GYN.  Final Clinical Impressions(s) / UC Diagnoses   Final diagnoses:  Epigastric pain  Positive pregnancy test     Discharge Instructions     Dietary changes.  Sit up right for at least 30 mins after eating.  Call a local GYN for an appt - [facility] or [facility].  Take care  Dr. Lacinda Axon    ED Prescriptions    Medication Sig Dispense Auth. Provider   Prenatal Vit-Fe Fumarate-FA (PRENATAL VITAMINS) 28-0.8 MG TABS Take 1 tablet by mouth daily. 90 tablet Thersa Salt G, DO     PDMP not reviewed this encounter.   Coral Spikes, [place] 03/29/19 1451

## 2019-03-29 NOTE — ED Triage Notes (Signed)
Patient c/o abdominal pain that started 1 week ago. She endorses nausea, denies vomiting and diarrhea. She states her cycle was supposed to start on 1/31 but that it is normal for her to be late sometimes. She states there is a possibility that she could be pregnant.

## 2019-03-29 NOTE — Discharge Instructions (Signed)
Dietary changes.  Sit up right for at least 30 mins after eating.  Call a local GYN for an appt - [facility] or [facility].  Take care  Dr. Adriana Simas

## 2019-05-04 ENCOUNTER — Other Ambulatory Visit: Payer: Self-pay

## 2019-05-04 ENCOUNTER — Emergency Department: Payer: Medicaid Other

## 2019-05-04 ENCOUNTER — Emergency Department
Admission: EM | Admit: 2019-05-04 | Discharge: 2019-05-04 | Disposition: A | Discharge status: Home / Self Care | Payer: Medicaid Other | Attending: Student in an Organized Health Care Education/Training Program | Admitting: Student in an Organized Health Care Education/Training Program

## 2019-05-04 DIAGNOSIS — J45909 Unspecified asthma, uncomplicated: Secondary | ICD-10-CM | POA: Diagnosis not present

## 2019-05-04 DIAGNOSIS — R509 Fever, unspecified: Secondary | ICD-10-CM | POA: Diagnosis present

## 2019-05-04 DIAGNOSIS — U071 COVID-19: Secondary | ICD-10-CM | POA: Diagnosis not present

## 2019-05-04 LAB — CBC WITH DIFFERENTIAL/PLATELET
Abs Immature Granulocytes: 0.04 10*3/uL (ref 0.00–0.07)
Basophils Absolute: 0 10*3/uL (ref 0.0–0.1)
Basophils Relative: 0 %
Eosinophils Absolute: 0.1 10*3/uL (ref 0.0–0.5)
Eosinophils Relative: 1 %
HCT: 32.4 % — ABNORMAL LOW (ref 36.0–46.0)
Hemoglobin: 10.2 g/dL — ABNORMAL LOW (ref 12.0–15.0)
Immature Granulocytes: 1 %
Lymphocytes Relative: 8 %
Lymphs Abs: 0.5 10*3/uL — ABNORMAL LOW (ref 0.7–4.0)
MCH: 25.2 pg — ABNORMAL LOW (ref 26.0–34.0)
MCHC: 31.5 g/dL (ref 30.0–36.0)
MCV: 80 fL (ref 80.0–100.0)
Monocytes Absolute: 1.3 10*3/uL — ABNORMAL HIGH (ref 0.1–1.0)
Monocytes Relative: 19 %
Neutro Abs: 4.9 10*3/uL (ref 1.7–7.7)
Neutrophils Relative %: 71 %
Platelets: 232 10*3/uL (ref 150–400)
RBC: 4.05 MIL/uL (ref 3.87–5.11)
RDW: 14.7 % (ref 11.5–15.5)
WBC: 6.9 10*3/uL (ref 4.0–10.5)
nRBC: 0 % (ref 0.0–0.2)

## 2019-05-04 LAB — COMPREHENSIVE METABOLIC PANEL
ALT: 28 U/L (ref 0–44)
AST: 25 U/L (ref 15–41)
Albumin: 3.8 g/dL (ref 3.5–5.0)
Alkaline Phosphatase: 70 U/L (ref 38–126)
Anion gap: 9 (ref 5–15)
BUN: 10 mg/dL (ref 6–20)
CO2: 22 mmol/L (ref 22–32)
Calcium: 8.8 mg/dL — ABNORMAL LOW (ref 8.9–10.3)
Chloride: 102 mmol/L (ref 98–111)
Creatinine, Ser: 0.91 mg/dL (ref 0.44–1.00)
GFR calc Af Amer: >60 mL/min (ref >60)
GFR calc non Af Amer: >60 mL/min (ref >60)
Glucose, Bld: 102 mg/dL — ABNORMAL HIGH (ref 70–99)
Potassium: 3.6 mmol/L (ref 3.5–5.1)
Sodium: 133 mmol/L — ABNORMAL LOW (ref 135–145)
Total Bilirubin: 0.4 mg/dL (ref 0.3–1.2)
Total Protein: 7.8 g/dL (ref 6.5–8.1)

## 2019-05-04 LAB — URINALYSIS, COMPLETE (UACMP) WITH MICROSCOPIC
Bacteria, UA: NONE SEEN
Bilirubin Urine: NEGATIVE
Glucose, UA: NEGATIVE mg/dL
Hgb urine dipstick: NEGATIVE
Ketones, ur: NEGATIVE mg/dL
Leukocytes,Ua: NEGATIVE
Nitrite: NEGATIVE
Protein, ur: NEGATIVE mg/dL
Specific Gravity, Urine: 1.005 (ref 1.005–1.030)
pH: 6 (ref 5.0–8.0)

## 2019-05-04 LAB — POC SARS CORONAVIRUS 2 AG: SARS Coronavirus 2 Ag: POSITIVE — AB

## 2019-05-04 LAB — LACTIC ACID, PLASMA: Lactic Acid, Venous: 0.7 mmol/L (ref 0.5–1.9)

## 2019-05-04 MED ORDER — ACETAMINOPHEN 500 MG PO TABS
1000.0000 mg | ORAL_TABLET | Freq: Once | ORAL | Status: AC
  Administered 2019-05-04: 1000 mg via ORAL
  Filled 2019-05-04: qty 2

## 2019-05-04 MED ORDER — PROCHLORPERAZINE EDISYLATE 10 MG/2ML IJ SOLN
10.0000 mg | Freq: Once | INTRAMUSCULAR | Status: AC
  Administered 2019-05-04: 10 mg via INTRAVENOUS
  Filled 2019-05-04: qty 2

## 2019-05-04 MED ORDER — IBUPROFEN 800 MG PO TABS
800.0000 mg | ORAL_TABLET | Freq: Once | ORAL | Status: AC
  Administered 2019-05-04: 01:00:00 800 mg via ORAL
  Filled 2019-05-04: qty 1

## 2019-05-04 MED ORDER — ONDANSETRON HCL 4 MG PO TABS: 4.0000 mg | ORAL_TABLET | Freq: Every day | ORAL | 0 refills | Status: DC | PRN

## 2019-05-04 MED ORDER — SODIUM CHLORIDE 0.9 % IV BOLUS
500.0000 mL | Freq: Once | INTRAVENOUS | Status: AC
  Administered 2019-05-04: 03:00:00 500 mL via INTRAVENOUS

## 2019-05-04 MED ORDER — DIPHENHYDRAMINE HCL 50 MG/ML IJ SOLN
12.5000 mg | Freq: Once | INTRAMUSCULAR | Status: AC
  Administered 2019-05-04: 12.5 mg via INTRAVENOUS
  Filled 2019-05-04: qty 1

## 2019-05-04 NOTE — ED Triage Notes (Signed)
Pt in with co fever since yesterday. Arrived from home by EMS, per EMS temp 102.5, HR 110 bp 116/68. Pt states has had chills and body aches, runny nose and congestion. Denies any n.v.d or dysuria.

## 2019-05-04 NOTE — ED Provider Notes (Signed)
[facility] Emergency Department Provider Note    First MD Initiated Contact with Patient 05/04/19 [phone]     (approximate)  I have reviewed the triage vital signs and the nursing notes.   HISTORY  Chief Complaint Fever    HPI Catherine Hanson is a 24 y.o. female bolus past medical history presents the ER for evaluation of less than 24 hours of of backache congestion chills and headache.  Denies any neck pain.  Has been having nonproductive cough.  Denies any abdominal pain nausea or vomiting.  Is not the worst headache of her life.  Denies any abdominal pain.  Primary concern is congestion.  Is not been on any recent antibiotics.  Has not had her Covid test.   Past Medical History:  Diagnosis Date  . Asthma   . Morbid obesity ([place])    No family history on file. Past Surgical History:  Procedure Laterality Date  . APPENDECTOMY     Patient Active Problem List   Diagnosis Date Noted  . Morbid obesity ([place])   . Grief 03/30/2018  . Flu-like symptoms 03/29/2018      Prior to Admission medications   Medication Sig Start Date End Date Taking? Authorizing Provider  ondansetron (ZOFRAN) 4 MG tablet Take 1 tablet (4 mg total) by mouth daily as needed. 05/04/19 05/03/20  Merlyn Lot, MD  Prenatal Vit-Fe Fumarate-FA (PRENATAL VITAMINS) 28-0.8 MG TABS Take 1 tablet by mouth daily. 03/29/19   Coral Spikes, DO  albuterol (PROVENTIL HFA;VENTOLIN HFA) 108 (90 Base) MCG/ACT inhaler Inhale 2 puffs into the lungs every 4 (four) hours as needed. 11/08/12 03/29/19  [provider]    Allergies Tamiflu [oseltamivir phosphate]    Social History Social History   Tobacco Use  . Smoking status: Never Smoker  . Smokeless tobacco: Never Used  Substance Use Topics  . Alcohol use: Never  . Drug use: Never    Review of Systems Patient denies headaches, rhinorrhea, blurry vision, numbness, shortness of breath, chest pain, edema, cough, abdominal pain,  nausea, vomiting, diarrhea, dysuria, fevers, rashes or hallucinations unless otherwise stated above in HPI. ____________________________________________   PHYSICAL EXAM:  VITAL SIGNS: Vitals:   05/04/19 0500 05/04/19 0618  BP: 129/81 123/70  Pulse: (!) 101 96  Resp:  18  Temp:  98.6 F (37 C)  SpO2: 97% 97%    Constitutional: Alert and oriented. Non toxic appearing   Eyes: Conjunctivae are normal.  Head: Atraumatic. Nose: No congestion/rhinnorhea. Mouth/Throat: Mucous membranes are moist.   Neck: No stridor. Painless ROM. No meningismus Cardiovascular: Normal rate, regular rhythm. Grossly normal heart sounds.  Good peripheral circulation. Respiratory: Normal respiratory effort.  No retractions. Lungs CTAB. Gastrointestinal: Soft and nontender. No distention. No abdominal bruits. No CVA tenderness. Genitourinary:  Musculoskeletal: No lower extremity tenderness nor edema.  No joint effusions. Neurologic:  Normal speech and language. No gross focal neurologic deficits are appreciated. No facial droop Skin:  Skin is warm, dry and intact. No rash noted. Psychiatric: Mood and affect are normal. Speech and behavior are normal.  ____________________________________________   LABS (all labs ordered are listed, but only abnormal results are displayed)  No results found for this or any previous visit (from the past 24 hour(s)). ____________________________________________  EKG____________________________________________  RADIOLOGY  I personally reviewed all radiographic images ordered to evaluate for the above acute complaints and reviewed radiology reports and findings.  These findings were personally discussed with the patient.  Please see medical record for radiology report.  ____________________________________________   PROCEDURES  Procedure(s) performed:  Procedures    Critical Care performed: no ____________________________________________   INITIAL IMPRESSION  / ASSESSMENT AND PLAN / ED COURSE  Pertinent labs & imaging results that were available during my care of the patient were reviewed by me and considered in my medical decision making (see chart for details).   DDX: Covid, URI, pneumonia, UTI, bacteremia  Catherine Hanson is a 24 y.o. who presents to the ED with symptoms as described above.  Have a high suspicion this is related to Covid given her presentation.  Blood work was sent for above differential.  Will provide symptomatic management.  Clinical Course as of May 06 1499  Fri May 04, 2019  0175 Patient is Covid positive which would explain her symptoms.  Temperature has defervesced.  She is currently resting comfortably.   [PR]  [phone] Patient reassessed.  Nontoxic-appearing.  Tolerating p.o.  Patient appropriate for outpatient follow-up.   [PR]    Clinical Course User Index [PR] Willy Eddy, MD    The patient was evaluated in Emergency Department today for the symptoms described in the history of present illness. He/she was evaluated in the context of the global COVID-19 pandemic, which necessitated consideration that the patient might be at risk for infection with the SARS-CoV-2 virus that causes COVID-19. Institutional protocols and algorithms that pertain to the evaluation of patients at risk for COVID-19 are in a state of rapid change based on information released by regulatory bodies including the CDC and federal and state organizations. These policies and algorithms were followed during the patient's care in the ED.  As part of my medical decision making, I reviewed the following data within the electronic MEDICAL RECORD NUMBER Nursing notes reviewed and incorporated, Labs reviewed, notes from prior ED visits and [place] Controlled Substance Database   ____________________________________________   FINAL CLINICAL IMPRESSION(S) / ED DIAGNOSES  Final diagnoses:  COVID-19 virus infection      NEW MEDICATIONS STARTED DURING THIS  VISIT:  Discharge Medication List as of 05/04/2019  5:48 AM    START taking these medications   Details  ondansetron (ZOFRAN) 4 MG tablet Take 1 tablet (4 mg total) by mouth daily as needed., Starting Fri 05/04/2019, Until Sat 05/03/2020, Normal         Note:  This document was prepared using Dragon voice recognition software and may include unintentional dictation errors.    Willy Eddy, MD 05/07/19 1501

## 2019-05-05 ENCOUNTER — Encounter: Payer: Self-pay | Admitting: Physician Assistant

## 2019-05-05 ENCOUNTER — Telehealth: Payer: Self-pay | Admitting: Physician Assistant

## 2019-05-05 NOTE — Telephone Encounter (Signed)
Called to discuss with patient about Covid symptoms and the use of bamlanivimab or casirivimab/imdevimab, a monoclonal antibody infusion for those with mild to moderate Covid symptoms and at a high risk of hospitalization.  Pt is qualified for this infusion at the [facility] infusion center due to BMI>35 but also had a + hcg pregnancy test.   Not able to leave a VM on her phone.   Cline Crock PA-C  [facility]

## 2019-08-15 ENCOUNTER — Ambulatory Visit
Admission: EM | Admit: 2019-08-15 | Discharge: 2019-08-15 | Disposition: A | Discharge status: Home / Self Care | Payer: Medicaid Other | Attending: Emergency Medicine | Admitting: Emergency Medicine

## 2019-08-15 ENCOUNTER — Other Ambulatory Visit: Payer: Self-pay

## 2019-08-15 ENCOUNTER — Encounter: Payer: Self-pay | Admitting: Emergency Medicine

## 2019-08-15 DIAGNOSIS — R519 Headache, unspecified: Secondary | ICD-10-CM

## 2019-08-15 DIAGNOSIS — J069 Acute upper respiratory infection, unspecified: Secondary | ICD-10-CM | POA: Diagnosis not present

## 2019-08-15 MED ORDER — PSEUDOEPHEDRINE HCL 60 MG PO TABS: 60.0000 mg | ORAL_TABLET | Freq: Four times a day (QID) | ORAL | 0 refills | Status: AC | PRN

## 2019-08-15 NOTE — Discharge Instructions (Addendum)
Recommend start Sudafed 60mg  every 6 hours as needed for congestion. Increase fluids to help loosen up mucus in sinuses. Rest. Follow-up if symptoms do not improve within 3 to 4 days.

## 2019-08-15 NOTE — ED Provider Notes (Signed)
[facility]    CSN: [id number] Arrival date & time: 08/15/19  1331      History   Chief Complaint No chief complaint on file.   HPI Catherine Hanson is a 24 y.o. female.   24 year old female presents with nasal congestion, sneezing, cough, sore throat, headache and sinus pressure that started yesterday. Denies any fever, vomiting or diarrhea. Has had a decreased appetite, some nausea and fatigue. Has taken Mucinex with minimal relief. Was around a friend recently with URI/cold symptoms. No known exposure to COVID 19. Was positive for COVID 19 in March 2021 but symptoms much more severe. Has not been vaccinated yet. Not currently pregnant- on Sprintec. Other chronic health issues include asthma and currently uses Albuterol prn.   The history is provided by the patient.    Past Medical History:  Diagnosis Date  . Asthma   . Morbid obesity [facility])     Patient Active Problem List   Diagnosis Date Noted  . Morbid obesity ([facility])   . Grief 03/30/2018  . Flu-like symptoms 03/29/2018    Past Surgical History:  Procedure Laterality Date  . APPENDECTOMY      OB History   No obstetric history on file.      Home Medications    Prior to Admission medications   Medication Sig Start Date End Date Taking? Authorizing Provider  albuterol (VENTOLIN HFA) 108 (90 Base) MCG/ACT inhaler Inhale into the lungs. 05/21/19  Yes [provider]  norgestimate-ethinyl estradiol (SPRINTEC 28) 0.25-35 MG-MCG tablet Take 1 tablet by mouth daily. 04/25/19  Yes [provider]  pseudoephedrine (SUDAFED) 60 MG tablet Take 1 tablet (60 mg total) by mouth every 6 (six) hours as needed for congestion. 08/15/19   Sudie Grumbling, NP    Family History History reviewed. No pertinent family history.  Social History Social History   Tobacco Use  . Smoking status: Never Smoker  . Smokeless tobacco: Never Used  Vaping Use  . Vaping Use: Never used  Substance Use Topics  .  Alcohol use: Never  . Drug use: Never     Allergies   Tamiflu [oseltamivir phosphate]   Review of Systems Review of Systems  Constitutional: Positive for appetite change, chills and fatigue. Negative for activity change and fever.  HENT: Positive for congestion, postnasal drip, rhinorrhea, sinus pressure, sneezing and sore throat. Negative for ear discharge, ear pain, facial swelling, mouth sores, nosebleeds and trouble swallowing.   Eyes: Negative for pain, discharge, redness and itching.  Respiratory: Positive for cough. Negative for chest tightness, shortness of breath and wheezing.   Gastrointestinal: Positive for nausea. Negative for diarrhea and vomiting.  Musculoskeletal: Negative for arthralgias, myalgias, neck pain and neck stiffness.  Skin: Negative for color change, rash and wound.  Allergic/Immunologic: Positive for environmental allergies. Negative for food allergies and immunocompromised state.  Neurological: Positive for headaches. Negative for dizziness, seizures, syncope, weakness and light-headedness.  Hematological: Negative for adenopathy. Does not bruise/bleed easily.     Physical Exam Triage Vital Signs ED Triage Vitals  Enc Vitals Group     BP 08/15/19 1355 107/70     Pulse Rate 08/15/19 1355 89     Resp 08/15/19 1355 18     Temp 08/15/19 1355 98.3 F (36.8 C)     Temp Source 08/15/19 1355 Oral     SpO2 08/15/19 1355 100 %     Weight 08/15/19 1352 250 lb (113.4 kg)     Height 08/15/19 1352  5\' 4"  (1.626 m)     Head Circumference --      Peak Flow --      Pain Score 08/15/19 1352 6     Pain Loc --      Pain Edu? --      Excl. in GC? --    No data found.  Updated Vital Signs BP 107/70 (BP Location: Left Arm)   Pulse 89   Temp 98.3 F (36.8 C) (Oral)   Resp 18   Ht 5\' 4"  (1.626 m)   Wt 250 lb (113.4 kg)   LMP 06/27/2019 (Approximate)   SpO2 100%   BMI 42.91 kg/m   Visual Acuity Right Eye Distance:   Left Eye Distance:   Bilateral  Distance:    Right Eye Near:   Left Eye Near:    Bilateral Near:     Physical Exam Vitals and nursing note reviewed.  Constitutional:      General: She is awake. She is not in acute distress.    Appearance: She is well-developed, well-groomed and overweight. She is ill-appearing.     Comments: She is sitting comfortably on exam table in no acute distress but appears ill.   HENT:     Head: Normocephalic and atraumatic.     Right Ear: Hearing, tympanic membrane, ear canal and external ear normal.     Left Ear: Hearing, tympanic membrane, ear canal and external ear normal.     Nose: Congestion present.     Right Sinus: Maxillary sinus tenderness present. No frontal sinus tenderness.     Left Sinus: Maxillary sinus tenderness present. No frontal sinus tenderness.     Mouth/Throat:     Lips: Pink.     Mouth: Mucous membranes are moist.     Pharynx: Oropharynx is clear. Uvula midline. Posterior oropharyngeal erythema present. No pharyngeal swelling or oropharyngeal exudate.  Eyes:     Extraocular Movements: Extraocular movements intact.     Conjunctiva/sclera: Conjunctivae normal.  Cardiovascular:     Rate and Rhythm: Normal rate and regular rhythm.     Heart sounds: Normal heart sounds. No murmur heard.   Pulmonary:     Effort: Pulmonary effort is normal. No respiratory distress.     Breath sounds: Normal breath sounds and air entry. No decreased air movement. No decreased breath sounds, wheezing, rhonchi or rales.  Musculoskeletal:        General: Normal range of motion.     Cervical back: Normal range of motion and neck supple. No rigidity or tenderness.  Lymphadenopathy:     Cervical: No cervical adenopathy.  Skin:    General: Skin is warm and dry.     Capillary Refill: Capillary refill takes less than 2 seconds.     Findings: No rash.  Neurological:     General: No focal deficit present.     Mental Status: She is alert and oriented to person, place, and time.  Psychiatric:         Mood and Affect: Mood normal.        Behavior: Behavior normal. Behavior is cooperative.        Thought Content: Thought content normal.        Judgment: Judgment normal.      UC Treatments / Results  Labs (all labs ordered are listed, but only abnormal results are displayed) Labs Reviewed - No data to display  EKG   Radiology No results found.  Procedures Procedures (including critical care time)  Medications  Ordered in UC Medications - No data to display  Initial Impression / Assessment and Plan / UC Course  I have reviewed the triage vital signs and the nursing notes.  Pertinent labs & imaging results that were available during my care of the patient were reviewed by me and considered in my medical decision making (see chart for details).    Reviewed with patient that she probably has a "cold" or URI- most likely viral. Low suspicion of COVID 19 due to previous infection within 90 days and symptoms are less severe than previous infection. Patient declined repeating COVID 19 test. Recommend trial Sudafed 60mg  every 6 hours as needed for congestion. Increase fluid intake to help loosen up mucus in sinuses. Encouraged to use Albuterol inhaler every 6 hours as needed for cough. Rest. Note written for work. Follow-up if symptoms do not improve within 3 to 4 days.  Final Clinical Impressions(s) / UC Diagnoses   Final diagnoses:  Acute upper respiratory infection  Sinus headache     Discharge Instructions     Recommend start Sudafed 60mg  every 6 hours as needed for congestion. Increase fluids to help loosen up mucus in sinuses. Rest. Follow-up if symptoms do not improve within 3 to 4 days.     ED Prescriptions    Medication Sig Dispense Auth. Provider   pseudoephedrine (SUDAFED) 60 MG tablet Take 1 tablet (60 mg total) by mouth every 6 (six) hours as needed for congestion. 30 tablet Kristel Durkee, Nicholes Stairs, NP     PDMP not reviewed this encounter.   Katy Apo,  NP 08/16/19 1100

## 2019-08-15 NOTE — ED Triage Notes (Signed)
Pt c/o nasal congestion, sneezing, cough, sputum production, post nasal drainage, and headache. Started yesterday. Denies fever. She states she had covid back in march. Declines covid testing.

## 2019-12-24 ENCOUNTER — Other Ambulatory Visit: Payer: Self-pay

## 2019-12-24 ENCOUNTER — Ambulatory Visit (LOCAL_COMMUNITY_HEALTH_CENTER): Payer: Medicaid Other

## 2019-12-24 VITALS — BP 115/66 | HR 90 | Ht 64.0 in | Wt 259.0 lb

## 2019-12-24 DIAGNOSIS — Z3201 Encounter for pregnancy test, result positive: Secondary | ICD-10-CM

## 2019-12-24 LAB — PREGNANCY, URINE: Preg Test, Ur: POSITIVE — AB

## 2019-12-24 NOTE — Progress Notes (Signed)
Vitamins declined. Client states needs gummy PNV. Per client, she plans [facility] at either [facility] or [facility]. Jossie Ng, RN

## 2021-05-16 ENCOUNTER — Emergency Department: Payer: Medicaid Other

## 2021-05-16 ENCOUNTER — Other Ambulatory Visit: Payer: Self-pay

## 2021-05-16 ENCOUNTER — Emergency Department
Admission: EM | Admit: 2021-05-16 | Discharge: 2021-05-16 | Disposition: A | Discharge status: Home / Self Care | Payer: Medicaid Other | Attending: Emergency Medicine | Admitting: Emergency Medicine

## 2021-05-16 DIAGNOSIS — R1030 Lower abdominal pain, unspecified: Secondary | ICD-10-CM | POA: Diagnosis not present

## 2021-05-16 DIAGNOSIS — J45909 Unspecified asthma, uncomplicated: Secondary | ICD-10-CM | POA: Diagnosis not present

## 2021-05-16 DIAGNOSIS — D649 Anemia, unspecified: Secondary | ICD-10-CM | POA: Diagnosis not present

## 2021-05-16 DIAGNOSIS — N939 Abnormal uterine and vaginal bleeding, unspecified: Secondary | ICD-10-CM | POA: Insufficient documentation

## 2021-05-16 LAB — HEPATIC FUNCTION PANEL
ALT: 13 U/L (ref 0–44)
AST: 19 U/L (ref 15–41)
Albumin: 3.7 g/dL (ref 3.5–5.0)
Alkaline Phosphatase: 86 U/L (ref 38–126)
Bilirubin, Direct: <0.1 mg/dL (ref 0.0–0.2)
Total Bilirubin: 0.8 mg/dL (ref 0.3–1.2)
Total Protein: 7.8 g/dL (ref 6.5–8.1)

## 2021-05-16 LAB — BASIC METABOLIC PANEL
Anion gap: 7 (ref 5–15)
BUN: 14 mg/dL (ref 6–20)
CO2: 25 mmol/L (ref 22–32)
Calcium: 8.7 mg/dL — ABNORMAL LOW (ref 8.9–10.3)
Chloride: 105 mmol/L (ref 98–111)
Creatinine, Ser: 0.78 mg/dL (ref 0.44–1.00)
GFR, Estimated: >60 mL/min (ref >60)
Glucose, Bld: 78 mg/dL (ref 70–99)
Potassium: 3.9 mmol/L (ref 3.5–5.1)
Sodium: 137 mmol/L (ref 135–145)

## 2021-05-16 LAB — CBC
HCT: 39.7 % (ref 36.0–46.0)
Hemoglobin: 11.9 g/dL — ABNORMAL LOW (ref 12.0–15.0)
MCH: 24.4 pg — ABNORMAL LOW (ref 26.0–34.0)
MCHC: 30 g/dL (ref 30.0–36.0)
MCV: 81.5 fL (ref 80.0–100.0)
Platelets: 286 10*3/uL (ref 150–400)
RBC: 4.87 MIL/uL (ref 3.87–5.11)
RDW: 15.6 % — ABNORMAL HIGH (ref 11.5–15.5)
WBC: 8 10*3/uL (ref 4.0–10.5)
nRBC: 0 % (ref 0.0–0.2)

## 2021-05-16 LAB — CHLAMYDIA/NGC RT PCR (ARMC ONLY)
Chlamydia Tr: NOT DETECTED
N gonorrhoeae: NOT DETECTED

## 2021-05-16 LAB — WET PREP, GENITAL
Clue Cells Wet Prep HPF POC: NONE SEEN
Sperm: NONE SEEN
Trich, Wet Prep: NONE SEEN
WBC, Wet Prep HPF POC: <10 (ref <10)
Yeast Wet Prep HPF POC: NONE SEEN

## 2021-05-16 LAB — HCG, QUANTITATIVE, PREGNANCY: hCG, Beta Chain, Quant, S: 1 m[IU]/mL (ref <5)

## 2021-05-16 LAB — LIPASE, BLOOD: Lipase: 31 U/L (ref 11–51)

## 2021-05-16 NOTE — ED Triage Notes (Signed)
Pt comes pov with vag bleeding since last night. Pt had positive preg test last week and started having vag bleeding with cramping.  ?

## 2021-05-16 NOTE — ED Notes (Signed)
Pt in ultrasound. Will obtain VS after pt returns to room. ?

## 2021-05-16 NOTE — ED Notes (Signed)
Discharge instructions and follow-up information provided. Patient verbalized understanding. Patient ambulated out to the waiting room with a steady gait.  °

## 2021-05-16 NOTE — ED Provider Notes (Signed)
? ?[facility] ?Provider Note ? ? ? Event Date/Time  ? First MD Initiated Contact with Patient 05/16/21 1558   ?  (approximate) ? ? ?History  ? ?Chief Complaint ?Vaginal Bleeding ? ? ?HPI ? ?Catherine Hanson is a 26 y.o. female with past medical history of asthma who presents to the ED complaining of vaginal bleeding.  Patient reports that her usual period was late and she subsequently took a pregnancy test that was faintly +8 days ago.  3 days ago, she developed lower abdominal cramping along with vaginal bleeding.  She states the bleeding has persisted since then and she has begun passing clots, reports having to change a pad almost every hour.  She describes the clots as about the size of a quarter and she occasionally has thin whitish discharge.  She has not noticed any passage of tissue and denies any fevers.  She has not had any changes in bowel movements, nausea, vomiting, or dysuria.  This would be her 3rd pregnancy, she reports 1 miscarriage and 1 elective abortion in the past. ?  ? ? ?Physical Exam  ? ?Triage Vital Signs: ?ED Triage Vitals  ?Enc Vitals Group  ?   BP 05/16/21 1441 102/61  ?   Pulse Rate 05/16/21 1441 85  ?   Resp 05/16/21 1441 18  ?   Temp 05/16/21 1441 98.9 ?F (37.2 ?C)  ?   Temp Source 05/16/21 1441 Oral  ?   SpO2 05/16/21 1441 99 %  ?   Weight --   ?   Height --   ?   Head Circumference --   ?   Peak Flow --   ?   Pain Score 05/16/21 1434 8  ?   Pain Loc --   ?   Pain Edu? --   ?   Excl. in GC? --   ? ? ?Most recent vital signs: ?Vitals:  ? 05/16/21 1441  ?BP: 102/61  ?Pulse: 85  ?Resp: 18  ?Temp: 98.9 ?F (37.2 ?C)  ?SpO2: 99%  ? ? ?Constitutional: Alert and oriented. ?Eyes: Conjunctivae are normal. ?Head: Atraumatic. ?Nose: No congestion/rhinnorhea. ?Mouth/Throat: Mucous membranes are moist.  ?Cardiovascular: Normal rate, regular rhythm. Grossly normal heart sounds.  2+ radial pulses bilaterally. ?Respiratory: Normal respiratory effort.  No retractions. Lungs  CTAB. ?Gastrointestinal: Soft and nontender. No distention. ?Genitourinary: Pelvic exam with moderate bleeding, no clots or discharge noted.  No cervical motion or Nexa tenderness noted. ?Musculoskeletal: No lower extremity tenderness nor edema.  ?Neurologic:  Normal speech and language. No gross focal neurologic deficits are appreciated. ? ? ? ?ED Results / Procedures / Treatments  ? ?Labs ?(all labs ordered are listed, but only abnormal results are displayed) ?Labs Reviewed  ?CBC - Abnormal; Notable for the following components:  ?    Result Value  ? Hemoglobin 11.9 (*)   ? MCH 24.4 (*)   ? RDW 15.6 (*)   ? All other components within normal limits  ?BASIC METABOLIC PANEL - Abnormal; Notable for the following components:  ? Calcium 8.7 (*)   ? All other components within normal limits  ?CHLAMYDIA/NGC RT PCR ([facility] ONLY)            ?WET PREP, GENITAL  ?HCG, QUANTITATIVE, PREGNANCY  ?HEPATIC FUNCTION PANEL  ?LIPASE, BLOOD  ? ? ?RADIOLOGY ?Pelvic ultrasound reviewed by me with appropriate blood flow to both ovaries and no intrauterine pregnancy noted. ? ?PROCEDURES: ? ?Critical Care performed: No ? ?Procedures ? ? ?MEDICATIONS ORDERED  IN ED: ?Medications - No data to display ? ? ?IMPRESSION / MDM / ASSESSMENT AND PLAN / ED COURSE  ?I reviewed the triage vital signs and the nursing notes. ?             ?               ? ?26 y.o. female with no significant past medical history presents to the ED complaining of 3 days of vaginal bleeding after a possible positive pregnancy test at home just over 1 week ago. ? ?Differential diagnosis includes, but is not limited to, ectopic pregnancy, miscarriage, uterine fibroids, abnormal uterine bleeding. ? ?Patient has a benign abdominal exam, does have moderate bleeding on pelvic exam but with no clots, discharge, or cervical motion tenderness.  Beta-hCG level is 1 and false positive pregnancy test at home seems likely, but would also consider very early miscarriage. Labs thus far  are reassuring, CBC shows stable anemia with no leukocytosis, BMP without electrolyte abnormality.  We will add on LFTs and lipase, check pelvic ultrasound for etiology of bleeding. ? ?LFTs and lipase are within normal limits, pelvic ultrasound remarkable only for small amount of intrauterine bleeding, no evidence of pregnancy or torsion at this time.  Patient is appropriate for discharge home with OB/GYN follow-up, was counseled to return to the ED for new or worsening symptoms, patient agrees with plan. ? ?  ? ? ?FINAL CLINICAL IMPRESSION(S) / ED DIAGNOSES  ? ?Final diagnoses:  ?Vaginal bleeding  ? ? ? ?Rx / DC Orders  ? ?ED Discharge Orders   ? ? None  ? ?  ? ? ? ?Note:  This document was prepared using Dragon voice recognition software and may include unintentional dictation errors. ?  ?Chesley Noon, MD ?05/16/21 1937 ? ?

## 2021-11-05 ENCOUNTER — Encounter (HOSPITAL_BASED_OUTPATIENT_CLINIC_OR_DEPARTMENT_OTHER): Payer: Self-pay

## 2021-11-05 ENCOUNTER — Emergency Department (HOSPITAL_BASED_OUTPATIENT_CLINIC_OR_DEPARTMENT_OTHER)
Admission: EM | Admit: 2021-11-05 | Discharge: 2021-11-05 | Disposition: A | Discharge status: Home / Self Care | Payer: Medicaid Other | Attending: Emergency Medicine | Admitting: Emergency Medicine

## 2021-11-05 ENCOUNTER — Other Ambulatory Visit: Payer: Self-pay

## 2021-11-05 DIAGNOSIS — R42 Dizziness and giddiness: Secondary | ICD-10-CM | POA: Insufficient documentation

## 2021-11-05 DIAGNOSIS — R110 Nausea: Secondary | ICD-10-CM | POA: Insufficient documentation

## 2021-11-05 DIAGNOSIS — R1013 Epigastric pain: Secondary | ICD-10-CM | POA: Insufficient documentation

## 2021-11-05 LAB — COMPREHENSIVE METABOLIC PANEL
ALT: 12 U/L (ref 0–44)
AST: 16 U/L (ref 15–41)
Albumin: 3.9 g/dL (ref 3.5–5.0)
Alkaline Phosphatase: 83 U/L (ref 38–126)
Anion gap: 8 (ref 5–15)
BUN: 8 mg/dL (ref 6–20)
CO2: 25 mmol/L (ref 22–32)
Calcium: 9 mg/dL (ref 8.9–10.3)
Chloride: 103 mmol/L (ref 98–111)
Creatinine, Ser: 0.81 mg/dL (ref 0.44–1.00)
GFR, Estimated: >60 mL/min (ref >60)
Glucose, Bld: 85 mg/dL (ref 70–99)
Potassium: 3.4 mmol/L — ABNORMAL LOW (ref 3.5–5.1)
Sodium: 136 mmol/L (ref 135–145)
Total Bilirubin: 0.6 mg/dL (ref 0.3–1.2)
Total Protein: 8.1 g/dL (ref 6.5–8.1)

## 2021-11-05 LAB — CBC
HCT: 38 % (ref 36.0–46.0)
Hemoglobin: 11.8 g/dL — ABNORMAL LOW (ref 12.0–15.0)
MCH: 24.4 pg — ABNORMAL LOW (ref 26.0–34.0)
MCHC: 31.1 g/dL (ref 30.0–36.0)
MCV: 78.5 fL — ABNORMAL LOW (ref 80.0–100.0)
Platelets: 289 10*3/uL (ref 150–400)
RBC: 4.84 MIL/uL (ref 3.87–5.11)
RDW: 15.6 % — ABNORMAL HIGH (ref 11.5–15.5)
WBC: 10.1 10*3/uL (ref 4.0–10.5)
nRBC: 0 % (ref 0.0–0.2)

## 2021-11-05 LAB — URINALYSIS, ROUTINE W REFLEX MICROSCOPIC
Bilirubin Urine: NEGATIVE
Glucose, UA: NEGATIVE mg/dL
Hgb urine dipstick: NEGATIVE
Ketones, ur: NEGATIVE mg/dL
Leukocytes,Ua: NEGATIVE
Nitrite: NEGATIVE
Protein, ur: NEGATIVE mg/dL
Specific Gravity, Urine: 1.01 (ref 1.005–1.030)
pH: 6.5 (ref 5.0–8.0)

## 2021-11-05 LAB — LIPASE, BLOOD: Lipase: 26 U/L (ref 11–51)

## 2021-11-05 LAB — PREGNANCY, URINE: Preg Test, Ur: NEGATIVE

## 2021-11-05 MED ORDER — LIDOCAINE VISCOUS HCL 2 % MT SOLN
15.0000 mL | Freq: Once | OROMUCOSAL | Status: AC
  Administered 2021-11-05: 15 mL via OROMUCOSAL
  Filled 2021-11-05: qty 15

## 2021-11-05 MED ORDER — ONDANSETRON HCL 4 MG PO TABS: 4.0000 mg | ORAL_TABLET | Freq: Three times a day (TID) | ORAL | 0 refills | Status: AC | PRN

## 2021-11-05 MED ORDER — ACETAMINOPHEN 500 MG PO TABS
1000.0000 mg | ORAL_TABLET | Freq: Once | ORAL | Status: AC
  Administered 2021-11-05: 1000 mg via ORAL
  Filled 2021-11-05: qty 2

## 2021-11-05 MED ORDER — ALUM & MAG HYDROXIDE-SIMETH 200-200-20 MG/5ML PO SUSP
30.0000 mL | Freq: Once | ORAL | Status: AC
  Administered 2021-11-05: 30 mL via ORAL
  Filled 2021-11-05: qty 30

## 2021-11-05 MED ORDER — ONDANSETRON 4 MG PO TBDP
4.0000 mg | ORAL_TABLET | Freq: Once | ORAL | Status: AC
Start: 2021-11-05 — End: 2021-11-05
  Administered 2021-11-05: 4 mg via ORAL
  Filled 2021-11-05: qty 1

## 2021-11-05 NOTE — ED Triage Notes (Signed)
C/o abd pain while at work today with history of abd ulcers. No bleeding reported.

## 2021-11-05 NOTE — Discharge Instructions (Signed)

## 2021-11-05 NOTE — ED Provider Notes (Signed)
[facility] EMERGENCY DEPARTMENT Provider Note   CSN: [id number] Arrival date & time: 11/05/21  1059     History {Add pertinent medical, surgical, social history, OB history to HPI:1} Chief Complaint  Patient presents with   Abdominal Pain    Catherine Hanson is a 26 y.o. female who presents emergency department with chief complaint of epigastric abdominal pain nausea and lightheadedness.  Patient states that her symptoms began several days ago.  She has had progressively worsening epigastric pain, nausea, poor appetite and lightheadedness with standing.  She has a history of IBS, peptic ulcer disease.  She is on PPI therapy and takes MiraLAX daily.  She denies cough, sore throat, body aches, chills, fever.  She has had no vomiting. Denies diarrhea or constipation.  Last menstrual period was 10/29/2021 and patient took a home pregnancy test that was negative.  She denies urinary or vaginal symptoms.   Abdominal Pain      Home Medications Prior to Admission medications   Medication Sig Start Date End Date Taking? Authorizing Provider  albuterol (VENTOLIN HFA) 108 (90 Base) MCG/ACT inhaler Inhale into the lungs. Patient not taking: Reported on 05/16/2021 05/21/19   [provider]  pseudoephedrine (SUDAFED) 60 MG tablet Take 1 tablet (60 mg total) by mouth every 6 (six) hours as needed for congestion. Patient not taking: Reported on 12/24/2019 08/15/19   Sudie Grumbling, NP      Allergies    Tamiflu [oseltamivir phosphate]    Review of Systems   Review of Systems  Gastrointestinal:  Positive for abdominal pain.    Physical Exam Updated Vital Signs BP 109/71 (BP Location: Left Arm)   Pulse 69   Temp 97.9 F (36.6 C) (Oral)   Resp 18   Ht 5\' 4"  (1.626 m)   Wt 117.5 kg   SpO2 100%   BMI 44.46 kg/m  Physical Exam Vitals and nursing note reviewed.  Constitutional:      General: She is not in acute distress.    Appearance: She is well-developed. She is not  diaphoretic.  HENT:     Head: Normocephalic and atraumatic.     Right Ear: External ear normal.     Left Ear: External ear normal.     Nose: Nose normal.     Mouth/Throat:     Mouth: Mucous membranes are moist.  Eyes:     General: No scleral icterus.    Conjunctiva/sclera: Conjunctivae normal.  Cardiovascular:     Rate and Rhythm: Normal rate and regular rhythm.     Heart sounds: Normal heart sounds. No murmur heard.    No friction rub. No gallop.  Pulmonary:     Effort: Pulmonary effort is normal. No respiratory distress.     Breath sounds: Normal breath sounds.  Abdominal:     General: Bowel sounds are normal. There is no distension.     Palpations: Abdomen is soft. There is no mass.     Tenderness: There is generalized abdominal tenderness and tenderness in the epigastric area. There is no guarding.  Musculoskeletal:     Cervical back: Normal range of motion.  Skin:    General: Skin is warm and dry.  Neurological:     Mental Status: She is alert and oriented to person, place, and time.  Psychiatric:        Behavior: Behavior normal.     ED Results / Procedures / Treatments   Labs (all labs ordered are listed, but only abnormal results  are displayed) Labs Reviewed  LIPASE, BLOOD  COMPREHENSIVE METABOLIC PANEL  CBC  URINALYSIS, ROUTINE W REFLEX MICROSCOPIC  PREGNANCY, URINE    EKG None  Radiology No results found.  Procedures Procedures  {Document cardiac monitor, telemetry assessment procedure when appropriate:1}  Medications Ordered in ED Medications  ondansetron (ZOFRAN-ODT) disintegrating tablet 4 mg (has no administration in time range)  acetaminophen (TYLENOL) tablet 1,000 mg (has no administration in time range)    ED Course/ Medical Decision Making/ A&P                           Medical Decision Making Amount and/or Complexity of Data Reviewed Labs: ordered.  Risk OTC drugs. Prescription drug management.   ***  {Document critical care  time when appropriate:1} {Document review of labs and clinical decision tools ie heart score, Chads2Vasc2 etc:1}  {Document your independent review of radiology images, and any outside records:1} {Document your discussion with family members, caretakers, and with consultants:1} {Document social determinants of health affecting pt's care:1} {Document your decision making why or why not admission, treatments were needed:1} Final Clinical Impression(s) / ED Diagnoses Final diagnoses:  None    Rx / DC Orders ED Discharge Orders     None

## 2022-03-15 ENCOUNTER — Ambulatory Visit (LOCAL_COMMUNITY_HEALTH_CENTER): Payer: Self-pay

## 2022-03-15 DIAGNOSIS — Z111 Encounter for screening for respiratory tuberculosis: Secondary | ICD-10-CM

## 2022-03-18 ENCOUNTER — Ambulatory Visit (LOCAL_COMMUNITY_HEALTH_CENTER): Payer: Medicaid Other

## 2022-03-18 DIAGNOSIS — Z111 Encounter for screening for respiratory tuberculosis: Secondary | ICD-10-CM

## 2022-03-18 LAB — TB SKIN TEST
Induration: 0 mm
TB Skin Test: NEGATIVE

## 2022-06-15 ENCOUNTER — Ambulatory Visit
Admission: EM | Admit: 2022-06-15 | Discharge: 2022-06-15 | Disposition: A | Discharge status: Home / Self Care | Payer: Self-pay | Attending: Internal Medicine | Admitting: Internal Medicine

## 2022-06-15 DIAGNOSIS — J069 Acute upper respiratory infection, unspecified: Secondary | ICD-10-CM | POA: Insufficient documentation

## 2022-06-15 LAB — GROUP A STREP BY PCR: Group A Strep by PCR: NOT DETECTED

## 2022-06-15 MED ORDER — PROMETHAZINE-DM 6.25-15 MG/5ML PO SYRP
5.0000 mL | ORAL_SOLUTION | Freq: Four times a day (QID) | ORAL | 0 refills | Status: AC | PRN
Start: 2022-06-15 — End: ?

## 2022-06-15 MED ORDER — PREDNISONE 20 MG PO TABS: 40.0000 mg | ORAL_TABLET | Freq: Every day | ORAL | 0 refills | Status: AC

## 2022-06-15 NOTE — ED Provider Notes (Signed)
[facility]    CSN: [id number] Arrival date & time: 06/15/22  1804      History   Chief Complaint Chief Complaint  Patient presents with   Sore Throat    HPI Catherine Hanson is a 27 y.o. female.   Patient presents for evaluation of nasal congestion, rhinorrhea, sore throat, sneezing, productive cough, shortness of breath, nausea without vomiting present for 1 day.  Cough causing episodes of gagging and is worse at nighttime interfering with sleep.  Shortness of breath is experienced primarily with associated chest tightness.  Decreased appetite but tolerating some food and fluids.  Has attempted use of antihistamine and Benadryl which has been minimally effective.  History of asthma.  Denies fevers.     Past Medical History:  Diagnosis Date   Asthma    History of abnormal cervical Pap smear    History of anemia    History of chlamydia    treated   History of stomach ulcers    Morbid obesity     Patient Active Problem List   Diagnosis Date Noted   Morbid obesity    Grief 03/30/2018   Flu-like symptoms 03/29/2018    Past Surgical History:  Procedure Laterality Date   APPENDECTOMY     elective abortion     04/2019    OB History     Gravida  3   Para      Term      Preterm      AB  1   Living         SAB      IAB      Ectopic      Multiple      Live Births               Home Medications    Prior to Admission medications   Medication Sig Start Date End Date Taking? Authorizing Provider  albuterol (VENTOLIN HFA) 108 (90 Base) MCG/ACT inhaler Inhale into the lungs. Patient not taking: Reported on 05/16/2021 05/21/19   [provider]  ondansetron (ZOFRAN) 4 MG tablet Take 1 tablet (4 mg total) by mouth every 8 (eight) hours as needed for nausea or vomiting. 11/05/21   Arthor Captain, PA-C  pseudoephedrine (SUDAFED) 60 MG tablet Take 1 tablet (60 mg total) by mouth every 6 (six) hours as needed for congestion. Patient  not taking: Reported on 12/24/2019 08/15/19   Sudie Grumbling, NP    Family History History reviewed. No pertinent family history.  Social History Social History   Tobacco Use   Smoking status: Never   Smokeless tobacco: Never  Vaping Use   Vaping Use: Never used  Substance Use Topics   Alcohol use: Not Currently    Alcohol/week: 3.0 standard drinks of alcohol    Types: 3 Shots of liquor per week    Comment: Last ETOH use 12/22/2019.   Drug use: Not Currently    Types: Marijuana    Comment: Last marijuana (edible) 09/2019.     Allergies   Tamiflu [oseltamivir phosphate]   Review of Systems Review of Systems  Constitutional: Negative.   HENT:  Positive for congestion, rhinorrhea, sneezing and sore throat. Negative for dental problem, drooling, ear discharge, ear pain, facial swelling, hearing loss, mouth sores, nosebleeds, postnasal drip, sinus pressure, sinus pain, tinnitus, trouble swallowing and voice change.   Respiratory:  Positive for cough and shortness of breath. Negative for apnea, choking, chest tightness, wheezing and stridor.  Cardiovascular: Negative.   Gastrointestinal:  Positive for nausea. Negative for abdominal distention, abdominal pain, anal bleeding, blood in stool, constipation, diarrhea, rectal pain and vomiting.  Musculoskeletal:  Positive for myalgias. Negative for arthralgias, back pain, gait problem, joint swelling, neck pain and neck stiffness.  Skin: Negative.   Neurological: Negative.      Physical Exam Triage Vital Signs ED Triage Vitals  Enc Vitals Group     BP 06/15/22 1854 113/79     Pulse Rate 06/15/22 1854 (!) 104     Resp 06/15/22 1854 17     Temp 06/15/22 1854 98.7 F (37.1 C)     Temp Source 06/15/22 1854 Oral     SpO2 06/15/22 1854 96 %     Weight --      Height --      Head Circumference --      Peak Flow --      Pain Score 06/15/22 1853 5     Pain Loc --      Pain Edu? --      Excl. in GC? --    No data  found.  Updated Vital Signs BP 113/79 (BP Location: Right Arm)   Pulse (!) 104   Temp 98.7 F (37.1 C) (Oral)   Resp 17   LMP 06/01/2022 (Exact Date)   SpO2 96%   Visual Acuity Right Eye Distance:   Left Eye Distance:   Bilateral Distance:    Right Eye Near:   Left Eye Near:    Bilateral Near:     Physical Exam Constitutional:      Appearance: Normal appearance. She is well-developed.  HENT:     Right Ear: Tympanic membrane and ear canal normal.     Left Ear: Tympanic membrane and ear canal normal.     Nose: Congestion and rhinorrhea present.     Mouth/Throat:     Mouth: Mucous membranes are moist.     Pharynx: No posterior oropharyngeal erythema.     Tonsils: No tonsillar exudate. 0 on the right. 0 on the left.  Eyes:     Extraocular Movements: Extraocular movements intact.  Cardiovascular:     Rate and Rhythm: Normal rate and regular rhythm.     Pulses: Normal pulses.     Heart sounds: Normal heart sounds.  Pulmonary:     Effort: Pulmonary effort is normal.     Breath sounds: Normal breath sounds.  Skin:    General: Skin is warm and dry.  Neurological:     General: No focal deficit present.     Mental Status: She is alert and oriented to person, place, and time.  Psychiatric:        Mood and Affect: Mood normal.        Behavior: Behavior normal.      UC Treatments / Results  Labs (all labs ordered are listed, but only abnormal results are displayed) Labs Reviewed  GROUP A STREP BY PCR    EKG   Radiology No results found.  Procedures Procedures (including critical care time)  Medications Ordered in UC Medications - No data to display  Initial Impression / Assessment and Plan / UC Course  I have reviewed the triage vital signs and the nursing notes.  Pertinent labs & imaging results that were available during my care of the patient were reviewed by me and considered in my medical decision making (see chart for details).  Viral URI with  cough  Patient is in no  signs of distress nor toxic appearing.  Vital signs are stable.  Low suspicion for pneumonia, pneumothorax or bronchitis and therefore will defer imaging.  Strep testing negative.  Prescribed prednisone and Promethazine DM.May use additional over-the-counter medications as needed for supportive care.  May follow-up with urgent care as needed if symptoms persist or worsen.  Note given.   Final Clinical Impressions(s) / UC Diagnoses   Final diagnoses:  None   Discharge Instructions   None    ED Prescriptions   None    PDMP not reviewed this encounter.   Valinda Hoar, [place] 06/18/22 [phone]

## 2022-06-15 NOTE — ED Triage Notes (Signed)
Pt presents with sore throat, runny nose, coughing and sneezing.   States she has been feeling hot and then cold. States she took allergy medicine and nothing has helped.

## 2022-06-15 NOTE — Discharge Instructions (Addendum)
Your symptoms today are most likely being caused by a virus and should steadily improve in time it can take up to 7 to 10 days before you truly start to see a turnaround however things will get better  Testing is negative  Starting tomorrow take prednisone every morning with food for 5 days to help relax the airway, should help to calm your breathing and coughing  You may use cough syrup every 6 hours as needed for additional comfort, be mindful this may make you feel sleepy    You can take Tylenol and/or Ibuprofen as needed for fever reduction and pain relief.   For cough: honey 1/2 to 1 teaspoon (you can dilute the honey in water or another fluid).  You can also use guaifenesin and dextromethorphan for cough. You can use a humidifier for chest congestion and cough.  If you don't have a humidifier, you can sit in the bathroom with the hot shower running.      For sore throat: try warm salt water gargles, cepacol lozenges, throat spray, warm tea or water with lemon/honey, popsicles or ice, or OTC cold relief medicine for throat discomfort.   For congestion: take a daily anti-histamine like Zyrtec, Claritin, and a oral decongestant, such as pseudoephedrine.  You can also use Flonase 1-2 sprays in each nostril daily.   It is important to stay hydrated: drink plenty of fluids (water, gatorade/powerade/pedialyte, juices, or teas) to keep your throat moisturized and help further relieve irritation/discomfort.

## 2024-01-06 ENCOUNTER — Encounter (HOSPITAL_COMMUNITY): Payer: Self-pay | Admitting: Nurse Practitioner
